# Patient Record
Sex: Female | Born: 1970 | Race: White | Hispanic: No | Marital: Married | Smoking: Never smoker
Health system: Southern US, Community
[De-identification: ages and names within clinical notes are randomized; demographics above are authoritative.]

## PROBLEM LIST (undated history)

## (undated) HISTORY — PX: BREAST BIOPSY: SHX20

## (undated) HISTORY — PX: OTHER SURGICAL HISTORY: SHX169

## (undated) HISTORY — PX: CHOLECYSTECTOMY: SHX55

---

## 1997-11-12 ENCOUNTER — Other Ambulatory Visit: Admission: RE | Admit: 1997-11-12 | Discharge: 1997-11-12 | Payer: Self-pay | Admitting: Gynecology

## 1998-06-17 ENCOUNTER — Other Ambulatory Visit: Admission: RE | Admit: 1998-06-17 | Discharge: 1998-06-17 | Payer: Self-pay | Admitting: Gynecology

## 1998-10-08 ENCOUNTER — Other Ambulatory Visit: Admission: RE | Admit: 1998-10-08 | Discharge: 1998-10-08 | Payer: Self-pay | Admitting: Obstetrics and Gynecology

## 1999-04-13 ENCOUNTER — Inpatient Hospital Stay (HOSPITAL_COMMUNITY): Admission: AD | Admit: 1999-04-13 | Discharge: 1999-04-15 | Payer: Self-pay | Admitting: Obstetrics and Gynecology

## 1999-05-27 ENCOUNTER — Other Ambulatory Visit: Admission: RE | Admit: 1999-05-27 | Discharge: 1999-05-27 | Payer: Self-pay | Admitting: Obstetrics and Gynecology

## 2000-07-09 ENCOUNTER — Other Ambulatory Visit: Admission: RE | Admit: 2000-07-09 | Discharge: 2000-07-09 | Payer: Self-pay | Admitting: Obstetrics and Gynecology

## 2001-05-25 ENCOUNTER — Other Ambulatory Visit: Admission: RE | Admit: 2001-05-25 | Discharge: 2001-05-25 | Payer: Self-pay | Admitting: Obstetrics and Gynecology

## 2001-05-25 ENCOUNTER — Other Ambulatory Visit: Admission: RE | Admit: 2001-05-25 | Discharge: 2001-05-25 | Payer: Self-pay | Admitting: Ophthalmology

## 2001-05-26 ENCOUNTER — Ambulatory Visit (HOSPITAL_COMMUNITY): Admission: AD | Admit: 2001-05-26 | Discharge: 2001-05-26 | Payer: Self-pay | Admitting: Obstetrics and Gynecology

## 2001-05-26 ENCOUNTER — Encounter (INDEPENDENT_AMBULATORY_CARE_PROVIDER_SITE_OTHER): Payer: Self-pay

## 2001-10-19 ENCOUNTER — Other Ambulatory Visit: Admission: RE | Admit: 2001-10-19 | Discharge: 2001-10-19 | Payer: Self-pay | Admitting: Obstetrics and Gynecology

## 2002-02-28 ENCOUNTER — Inpatient Hospital Stay (HOSPITAL_COMMUNITY): Admission: AD | Admit: 2002-02-28 | Discharge: 2002-02-28 | Payer: Self-pay | Admitting: Obstetrics and Gynecology

## 2002-02-28 ENCOUNTER — Encounter: Payer: Self-pay | Admitting: Obstetrics and Gynecology

## 2002-03-14 ENCOUNTER — Encounter: Admission: RE | Admit: 2002-03-14 | Discharge: 2002-03-14 | Payer: Self-pay | Admitting: Obstetrics and Gynecology

## 2002-04-05 ENCOUNTER — Inpatient Hospital Stay (HOSPITAL_COMMUNITY): Admission: AD | Admit: 2002-04-05 | Discharge: 2002-04-08 | Payer: Self-pay | Admitting: Obstetrics and Gynecology

## 2002-04-06 ENCOUNTER — Encounter: Payer: Self-pay | Admitting: Obstetrics and Gynecology

## 2002-04-20 ENCOUNTER — Inpatient Hospital Stay (HOSPITAL_COMMUNITY): Admission: AD | Admit: 2002-04-20 | Discharge: 2002-04-20 | Payer: Self-pay | Admitting: Obstetrics and Gynecology

## 2002-04-21 ENCOUNTER — Inpatient Hospital Stay (HOSPITAL_COMMUNITY): Admission: AD | Admit: 2002-04-21 | Discharge: 2002-04-23 | Payer: Self-pay | Admitting: Obstetrics and Gynecology

## 2002-05-23 ENCOUNTER — Other Ambulatory Visit: Admission: RE | Admit: 2002-05-23 | Discharge: 2002-05-23 | Payer: Self-pay | Admitting: Obstetrics and Gynecology

## 2003-06-11 ENCOUNTER — Other Ambulatory Visit: Admission: RE | Admit: 2003-06-11 | Discharge: 2003-06-11 | Payer: Self-pay | Admitting: Obstetrics and Gynecology

## 2004-08-06 ENCOUNTER — Other Ambulatory Visit: Admission: RE | Admit: 2004-08-06 | Discharge: 2004-08-06 | Payer: Self-pay | Admitting: Obstetrics and Gynecology

## 2007-12-09 ENCOUNTER — Encounter: Admission: RE | Admit: 2007-12-09 | Discharge: 2007-12-09 | Payer: Self-pay | Admitting: Obstetrics and Gynecology

## 2008-12-12 ENCOUNTER — Encounter: Admission: RE | Admit: 2008-12-12 | Discharge: 2008-12-12 | Payer: Self-pay | Admitting: Obstetrics and Gynecology

## 2009-07-15 ENCOUNTER — Encounter: Admission: RE | Admit: 2009-07-15 | Discharge: 2009-07-15 | Payer: Self-pay | Admitting: Obstetrics and Gynecology

## 2010-07-11 NOTE — Op Note (Signed)
[facility]  Patient:    Julie [name], Julie [name] Visit Number: [id number] MRN: [id number]          Service Type: DSU Location: [facility] Attending Physician:  Rhina Brackett Dictated by:   Duke Salvia. Marcelle Overlie, M.D. Proc. Date: 05/26/01 Admit Date:  05/26/2001                             Operative Report  PREOPERATIVE DIAGNOSIS:       Missed abortion.  POSTOPERATIVE DIAGNOSIS:      Missed abortion.  OPERATION:                    Dilation and evacuation.  SURGEON:                      Duke Salvia. Marcelle Overlie, M.D.  ANESTHESIA:                   MAC plus paracervical block.  DESCRIPTION OF PROCEDURE:     The patient was taken to the operating room. After an adequate level of sedation was obtained, with the patients legs in stirrups the peritoneum and vagina were prepped with Betadine.  The bladder was emptied.  Exam was carried out.  Uterus was eight weeks size, midposition. The speculum was position.  The cervix was grasped with a tenaculum.  A paracervical block was created by infiltrating at 3 and 9 oclock submucosally followed by 7 cc of 1% Xylocaine on either side after negative aspiration. The uterus was then sounded to 10 cm and progressively dilated to a 29 Pratt. A #8 curved suction curet was then used to curet a moderate amount of tissue but no further tissue could be removed.  Small blunt curet was used to explore the cavity revealing the walls to be firm and clean.  She tolerated this well and went to the recovery room in good condition. Dictated by:   Duke Salvia. Marcelle Overlie, M.D. Attending Physician:  Rhina Brackett DD:  05/26/01 TD:  05/27/01 Job: [phone] [id number]

## 2010-07-11 NOTE — Discharge Summary (Signed)
   NAMEJERSEY, Julie [name]                       ACCOUNT NO.:  [id number]   MEDICAL RECORD NO.:  [id number]                   PATIENT TYPE:  INP   LOCATION:  9161                                 FACILITY:  [facility]   PHYSICIAN:  Juluis Mire, M.D.                DATE OF BIRTH:  1970-08-31   DATE OF ADMISSION:  04/05/2002  DATE OF DISCHARGE:  04/08/2002                                 DISCHARGE SUMMARY   ADMISSION DIAGNOSIS:  Intrauterine pregnancy at 34 weeks with preterm labor.   DISCHARGE DIAGNOSIS:  Intrauterine pregnancy at 34 weeks with preterm labor.   HISTORY OF PRESENT ILLNESS:  For complete history and physical, please see  the dictated note.   HOSPITAL COURSE:  The patient was brought in at 35 weeks with progressive  cervical changes.  She was 3-4 cm, 80% effaced, with membranes intact.  She  was begun on IV magnesium sulfate as well as antibiotics and given  betamethasone.  Ultrasound evaluation did reveal a thin cervix at 1.8 cm.  Amniotic fluid was normal.  Fetal growth parameters within normal limits.   The patient stopped easily with magnesium sulfate.  She was subsequently  weaned off the magnesium sulfate and begun on p.o. terbutaline.  She  actually remained stable on p.o. terbutaline without contractions or uterine  activity, and the cervix did tend to thicken up.  On 04/07/2002 she was noted  to be 3 cm and 50% effaced with the cervix posterior.  Subsequently, she was  maintained on p.o. terbutaline.  The following day, her cervix remained  unchanged.  Fetal heart rate was reactive without decelerations.  The  patient was discharged home to continue bed rest and p.o. terbutaline.   In terms of complications, none were encountered during her stay in the  hospital.  The patient is discharged home in stable condition.   DISPOSITION:  The patient is to continue on p.o. terbutaline at home as well  as rest.  She is to report increase in uterine activity, leaking, or  bleeding.  She is a gestational diabetic.  We will continue her on her diet,  watching blood sugars carefully.                                               Juluis Mire, M.D.    JSM/MEDQ  D:  04/08/2002  T:  04/08/2002  Job:  [id number]

## 2010-07-11 NOTE — H&P (Signed)
[facility]  Patient:    Julie [name], Julie [name] Visit Number: [id number] MRN: [id number]          Service Type: DSU Location: [facility] Attending Physician:  Rhina Brackett Dictated by:   Duke Salvia. Marcelle Overlie, M.D. Admit Date:  05/26/2001                           History and Physical  CHIEF COMPLAINT:              Missed abortion.  HISTORY OF PRESENT ILLNESS:   This 40 year old G2, P0-1-0-1 at nine weeks presented for a new OB appointment yesterday.  She was, by ultrasound, noted to have a missed abortion.  There was a nine-week IUP with no FHR noted.  She presents now for D&E.  This procedure including the risks of bleeding, infection, and other complications that may require additional surgery were reviewed with her.  Her blood type is O positive.  PAST OBSTETRIC HISTORY:       Vaginal delivery in February 2001.  ALLERGIES:                    None.  REVIEW OF SYSTEMS:            Unremarkable.  PHYSICAL EXAMINATION:  VITAL SIGNS:                  Temperature 98.2, blood pressure 110/70.  HEENT:                        Unremarkable.  NECK:                         Supple without masses.  LUNGS:                        Clear.  CARDIOVASCULAR:               Regular rate and rhythm without murmurs, rubs, or gallops noted.  BREASTS:                      Without masses.  ABDOMEN:                      Soft, flat and nontender.  PELVIC:                       Normal external genitalia.  The vagina and cervix were clear.  The uterus was nine weeks in size and midposition.  Adnexa negative.  EXTREMITIES:                  Unremarkable.  NEUROLOGIC:                   Unremarkable.  IMPRESSION:                   Nine-week intrauterine pregnancy, misses abortion.  PLAN:                         Dilation and evacuation.  the procedure and risks were reviewed as above. Dictated by:   Duke Salvia. Marcelle Overlie, M.D. Attending Physician:  Rhina Brackett DD:  05/25/01 TD:  05/26/01 Job: [phone] [id number]

## 2010-07-11 NOTE — H&P (Signed)
   [name], [name]                       ACCOUNT NO.:  [id number]   MEDICAL RECORD NO.:  [id number]                   PATIENT TYPE:  INP   LOCATION:  9161                                 FACILITY:  [facility]   PHYSICIAN:  Duke Salvia. Marcelle Overlie, M.D.            DATE OF BIRTH:  May 14, 1970   DATE OF ADMISSION:  04/05/2002  DATE OF DISCHARGE:                                HISTORY & PHYSICAL   CHIEF COMPLAINT:  Preterm labor.   HISTORY OF PRESENT ILLNESS:  The patient is a 40 year old, G3, P1-0-1-1, EDD  05/19/2002, EDA 34 weeks. The patient called earlier today saying she had  leaked a small amount of fluid. She was sent to triage for evaluation where  she was fern and nitrazine negative, does not appear to be uncomfortable,  but by routine check, she was 3 to 4 cm dilated with moderate bulging  membranes, vertex presenting, and is admitted for preterm labor.   Her prenatal course has been uncomplicated.  Her blood type is O positive,  rubella titer positive.  One-hour GTT was 150,  Three-hour GTT showed a one  hour of 220, two hour of 167.  She has been on low carbohydrate diet for  gestational diabetes.   ALLERGIES:  None.   PAST SURGICAL HISTORY:  1. Dilation and evacuation in 2003.  2. One vaginal delivery in 2001 of 5 pound 14 ounce female at 36 weeks.   REVIEW OF SYSTEMS:  Significant for a sister with MS.   PHYSICAL EXAMINATION:  VITAL SIGNS:  Temperature 98.2, blood pressure  108/68.  HEENT:  Unremarkable.  NECK:  Supple without masses.  LUNGS:  Clear.  CARDIOVASCULAR:  Regular rate and rhythm without murmur, rub, or gallop.  BREASTS:  Not examined.  ABDOMEN:  Height 34 cm, heart rate 140.  PELVIC:  Cervix by nurse's exam was 3 to 4, 80%, membranes intact, fern and  nitrazine negative.  EXTREMITIES:  Unremarkable.  NEUROLOGIC:  Unremarkable.   IMPRESSION:  1. Intrauterine pregnancy at 34 weeks.  2.     Gestational diabetes.  3. Preterm labor.   PLAN:  Will admit  for tocolytics, betamethasone, IV antibiotics.  OB  ultrasound in the a.m.                                                  Richard M. Marcelle Overlie, M.D.    RMH/MEDQ  D:  04/05/2002  T:  04/05/2002  Job:  [id number]

## 2010-10-10 ENCOUNTER — Other Ambulatory Visit: Payer: Self-pay | Admitting: Obstetrics and Gynecology

## 2010-11-20 ENCOUNTER — Other Ambulatory Visit: Payer: Self-pay | Admitting: Obstetrics and Gynecology

## 2010-11-20 DIAGNOSIS — N6489 Other specified disorders of breast: Secondary | ICD-10-CM

## 2011-02-09 ENCOUNTER — Other Ambulatory Visit: Payer: Self-pay | Admitting: Obstetrics and Gynecology

## 2011-02-09 DIAGNOSIS — N6489 Other specified disorders of breast: Secondary | ICD-10-CM

## 2011-02-23 ENCOUNTER — Ambulatory Visit
Admission: RE | Admit: 2011-02-23 | Discharge: 2011-02-23 | Disposition: A | Discharge status: Home / Self Care | Payer: 59 | Source: Ambulatory Visit | Attending: Obstetrics and Gynecology | Admitting: Obstetrics and Gynecology

## 2011-02-23 DIAGNOSIS — N6489 Other specified disorders of breast: Secondary | ICD-10-CM

## 2011-06-09 ENCOUNTER — Ambulatory Visit (INDEPENDENT_AMBULATORY_CARE_PROVIDER_SITE_OTHER): Payer: 59 | Admitting: Family Medicine

## 2011-06-09 ENCOUNTER — Ambulatory Visit: Payer: 59

## 2011-06-09 VITALS — BP 136/87 | HR 94 | Temp 98.0°F | Resp 18 | Ht 65.0 in | Wt 149.8 lb

## 2011-06-09 DIAGNOSIS — M79609 Pain in unspecified limb: Secondary | ICD-10-CM

## 2011-06-09 DIAGNOSIS — M79605 Pain in left leg: Secondary | ICD-10-CM

## 2011-06-09 MED ORDER — DICLOFENAC SODIUM 1 % TD GEL: 1.0000 "application " | Freq: Two times a day (BID) | TRANSDERMAL | Status: DC

## 2011-06-09 NOTE — Progress Notes (Signed)
  [facility]:  Office Visit  Chief Complaint:  Chief Complaint  Patient presents with  . Leg Swelling    left; injury from January 2013    HPI: Julie [name] is a 41 y.o. female who complains of  Left leg pain x 3 months, rate as 2-3/10. Aching, swelling at noon and night s/p use. Occurred after Storage ottoman landed on her when she was helping sister move. Landed on her leg and still has pain after all this time.    History reviewed. No pertinent past medical history. Past Surgical History  Procedure Date  . Cholecystectomy   . Uterine ablation     for menorrhagia   History   Social History  . Marital Status: Married    Spouse Name: N/A    Number of Children: N/A  . Years of Education: N/A   Social History Main Topics  . Smoking status: Never Smoker   . Smokeless tobacco: None  . Alcohol Use: No  . Drug Use: No  . Sexually Active: None   Other Topics Concern  . None   Social History Narrative  . None   Family History  Problem Relation Age of Onset  . Cancer Maternal Grandmother    No Known Allergies Prior to Admission medications   Medication Sig Start Date End Date Taking? Authorizing Provider  nortriptyline (PAMELOR) 75 MG capsule Take 75 mg by mouth at bedtime.   Yes Historical Provider, MD     ROS: The patient denies fevers, chills, night sweats, unintentional weight loss, chest pain, palpitations, wheezing, dyspnea on exertion, nausea, vomiting, abdominal pain, dysuria, hematuria, melena, numbness, weakness, or tingling. + leg pain  All other systems have been reviewed and were otherwise negative with the exception of those mentioned in the HPI and as above.    PHYSICAL EXAM: Filed Vitals:   06/09/11 1227  BP: 136/87  Pulse: 94  Temp: 98 F (36.7 C)  Resp: 18   Filed Vitals:   06/09/11 1227  Height: 5\' 5"  (1.651 m)  Weight: 149 lb 12.8 oz (67.949 kg)   Body mass index is 24.93 kg/(m^2).  General: Alert, no acute  distress HEENT:  Normocephalic, atraumatic, oropharynx patent.  Cardiovascular:  Regular rate and rhythm, no rubs murmurs or gallops.  No Carotid bruits, radial pulse intact. No pedal edema.  Respiratory: Clear to auscultation bilaterally.  No wheezes, rales, or rhonchi.  No cyanosis, no use of accessory musculature GI: No organomegaly, abdomen is soft and non-tender, positive bowel sounds.  No masses. Skin: No rashes. Neurologic: Facial musculature symmetric. Psychiatric: Patient is appropriate throughout our interaction. Lymphatic: No cervical lymphadenopathy Musculoskeletal: Gait intact. Right leg: Normal neurovasc. exam, + soft tissue tenderness on distal tibia ( rt), full ROM, 5/5 strength, sensation intact. There is slight fibrous sensation of small localized region along distal tibia.   LABS: No results found for this or any previous visit.   EKG/XRAY:   Primary read interpreted by Dr. Conley Rolls at [facility]. No fx/dislocation   ASSESSMENT/PLAN: Encounter Diagnosis  Name Primary?  . Left leg pain Yes   Most likely soft tissue contusion, ? Recovering bone contusion. Rx Voltaren get BID.  Reassurance given since xrays negative.  F/u prn   Brenner Visconti PHUONG, DO 06/09/2011 1:37 PM

## 2012-04-27 ENCOUNTER — Other Ambulatory Visit: Payer: Self-pay | Admitting: Obstetrics and Gynecology

## 2012-04-27 DIAGNOSIS — R928 Other abnormal and inconclusive findings on diagnostic imaging of breast: Secondary | ICD-10-CM

## 2012-05-10 ENCOUNTER — Ambulatory Visit
Admission: RE | Admit: 2012-05-10 | Discharge: 2012-05-10 | Disposition: A | Discharge status: Home / Self Care | Payer: 59 | Source: Ambulatory Visit | Attending: Obstetrics and Gynecology | Admitting: Obstetrics and Gynecology

## 2012-05-10 ENCOUNTER — Other Ambulatory Visit: Payer: Self-pay | Admitting: Obstetrics and Gynecology

## 2012-05-10 DIAGNOSIS — R928 Other abnormal and inconclusive findings on diagnostic imaging of breast: Secondary | ICD-10-CM

## 2012-05-10 DIAGNOSIS — N6320 Unspecified lump in the left breast, unspecified quadrant: Secondary | ICD-10-CM

## 2012-05-11 ENCOUNTER — Other Ambulatory Visit: Payer: Self-pay | Admitting: Obstetrics and Gynecology

## 2012-05-11 DIAGNOSIS — N6489 Other specified disorders of breast: Secondary | ICD-10-CM

## 2012-05-18 ENCOUNTER — Ambulatory Visit
Admission: RE | Admit: 2012-05-18 | Discharge: 2012-05-18 | Disposition: A | Discharge status: Home / Self Care | Payer: 59 | Source: Ambulatory Visit | Attending: Obstetrics and Gynecology | Admitting: Obstetrics and Gynecology

## 2012-05-18 DIAGNOSIS — N6489 Other specified disorders of breast: Secondary | ICD-10-CM

## 2012-05-18 MED ORDER — GADOBENATE DIMEGLUMINE 529 MG/ML IV SOLN
14.0000 mL | Freq: Once | INTRAVENOUS | Status: AC | PRN
  Administered 2012-05-18: 14 mL via INTRAVENOUS

## 2013-03-17 ENCOUNTER — Other Ambulatory Visit: Payer: Self-pay

## 2013-03-17 DIAGNOSIS — Z1231 Encounter for screening mammogram for malignant neoplasm of breast: Secondary | ICD-10-CM

## 2013-04-24 ENCOUNTER — Ambulatory Visit
Admission: RE | Admit: 2013-04-24 | Discharge: 2013-04-24 | Disposition: A | Discharge status: Home / Self Care | Payer: Self-pay | Source: Ambulatory Visit

## 2013-04-24 DIAGNOSIS — Z1231 Encounter for screening mammogram for malignant neoplasm of breast: Secondary | ICD-10-CM

## 2014-07-06 ENCOUNTER — Other Ambulatory Visit: Payer: Self-pay

## 2014-07-06 DIAGNOSIS — Z1231 Encounter for screening mammogram for malignant neoplasm of breast: Secondary | ICD-10-CM

## 2014-07-19 ENCOUNTER — Other Ambulatory Visit: Payer: Self-pay | Admitting: Obstetrics and Gynecology

## 2014-07-25 LAB — CYTOLOGY - PAP

## 2014-08-02 ENCOUNTER — Ambulatory Visit
Admission: RE | Admit: 2014-08-02 | Discharge: 2014-08-02 | Disposition: A | Discharge status: Home / Self Care | Payer: 59 | Source: Ambulatory Visit

## 2014-08-02 DIAGNOSIS — Z1231 Encounter for screening mammogram for malignant neoplasm of breast: Secondary | ICD-10-CM

## 2014-08-13 ENCOUNTER — Other Ambulatory Visit: Payer: Self-pay | Admitting: Obstetrics and Gynecology

## 2014-08-16 LAB — CYTOLOGY - PAP

## 2015-07-09 ENCOUNTER — Other Ambulatory Visit: Payer: Self-pay

## 2015-07-09 DIAGNOSIS — Z1231 Encounter for screening mammogram for malignant neoplasm of breast: Secondary | ICD-10-CM

## 2015-08-06 ENCOUNTER — Ambulatory Visit
Admission: RE | Admit: 2015-08-06 | Discharge: 2015-08-06 | Disposition: A | Discharge status: Home / Self Care | Payer: 59 | Source: Ambulatory Visit

## 2015-08-06 ENCOUNTER — Other Ambulatory Visit: Payer: Self-pay | Admitting: Obstetrics and Gynecology

## 2015-08-06 DIAGNOSIS — Z1231 Encounter for screening mammogram for malignant neoplasm of breast: Secondary | ICD-10-CM

## 2015-09-09 ENCOUNTER — Ambulatory Visit (INDEPENDENT_AMBULATORY_CARE_PROVIDER_SITE_OTHER): Payer: 59 | Admitting: Family Medicine

## 2015-09-09 VITALS — BP 126/78 | HR 92 | Temp 98.1°F | Resp 17 | Ht 65.5 in | Wt 153.0 lb

## 2015-09-09 DIAGNOSIS — W57XXXA Bitten or stung by nonvenomous insect and other nonvenomous arthropods, initial encounter: Secondary | ICD-10-CM

## 2015-09-09 DIAGNOSIS — S90861A Insect bite (nonvenomous), right foot, initial encounter: Secondary | ICD-10-CM | POA: Diagnosis not present

## 2015-09-09 DIAGNOSIS — S90821A Blister (nonthermal), right foot, initial encounter: Secondary | ICD-10-CM

## 2015-09-09 DIAGNOSIS — T63421A Toxic effect of venom of ants, accidental (unintentional), initial encounter: Secondary | ICD-10-CM

## 2015-09-09 MED ORDER — TRIAMCINOLONE ACETONIDE 0.1 % EX CREA: 1.0000 "application " | TOPICAL_CREAM | Freq: Two times a day (BID) | CUTANEOUS | Status: AC

## 2015-09-09 MED ORDER — METHYLPREDNISOLONE ACETATE 80 MG/ML IJ SUSP
80.0000 mg | Freq: Once | INTRAMUSCULAR | Status: AC
  Administered 2015-09-09: 80 mg via INTRAMUSCULAR

## 2015-09-09 NOTE — Patient Instructions (Signed)
You have received a shot of Depo-Medrol 80, and injectable long-acting cortisone which will keep treating this for the next few days.  Use triamcinolone cream 2 or 3 times daily on the blistered area  If the skin of the blister starts tearing away you can gently trim it off.  Return as needed

## 2015-09-09 NOTE — Progress Notes (Signed)
Patient ID: Julie [name], female    DOB: [birth year]  Age: 45 y.o. MRN: [id number]  Chief Complaint  Patient presents with  . Foot Injury    blister on right foot     Subjective:   Patient was walking her dog and was stung by a fire ant on her foot 2 days ago. Yesterday she noticed a blistering,. The blister is gotten large and tight. It itches.  Current allergies, medications, problem list, past/family and social histories reviewed.  Objective:  BP 126/78 mmHg  Pulse 92  Temp(Src) 98.1 F (36.7 C) (Oral)  Resp 17  Ht 5' 5.5" (1.664 m)  Wt 153 lb (69.4 kg)  BMI 25.06 kg/m2  SpO2 100%  Just proximal to her fourth and fifth toes of the right foot there is a blister about 2 x 3 cm cyst very tightly swollen, superficial skin involvement. This was cleaned with alcohol and the fluid aspirated out of it rather than allow it to pop and tear  Assessment & Plan:   Assessment: 1. Fire ant sting   2. Blister of foot, right, initial encounter       Plan: Used triamcinolone cream, give a shot of Depo-Medrol, return if problems  No orders of the defined types were placed in this encounter.    Meds ordered this encounter  Medications  . methylPREDNISolone acetate (DEPO-MEDROL) injection 80 mg    Sig:   . triamcinolone cream (KENALOG) 0.1 %    Sig: Apply 1 application topically 2 (two) times daily.    Dispense:  30 g    Refill:  0         Patient Instructions  You have received a shot of Depo-Medrol 80, and injectable long-acting cortisone which will keep treating this for the next few days.  Use triamcinolone cream 2 or 3 times daily on the blistered area  If the skin of the blister starts tearing away you can gently trim it off.  Return as needed     Return if symptoms worsen or fail to improve.   HOPPER,DAVID, MD 09/09/2015

## 2016-03-30 DIAGNOSIS — G43009 Migraine without aura, not intractable, without status migrainosus: Secondary | ICD-10-CM | POA: Diagnosis not present

## 2016-04-06 DIAGNOSIS — Z23 Encounter for immunization: Secondary | ICD-10-CM | POA: Diagnosis not present

## 2016-09-15 ENCOUNTER — Other Ambulatory Visit: Payer: Self-pay | Admitting: Obstetrics and Gynecology

## 2016-09-15 DIAGNOSIS — Z1231 Encounter for screening mammogram for malignant neoplasm of breast: Secondary | ICD-10-CM

## 2016-09-16 ENCOUNTER — Ambulatory Visit
Admission: RE | Admit: 2016-09-16 | Discharge: 2016-09-16 | Disposition: A | Discharge status: Home / Self Care | Payer: 59 | Source: Ambulatory Visit | Attending: Obstetrics and Gynecology | Admitting: Obstetrics and Gynecology

## 2016-09-16 DIAGNOSIS — Z1231 Encounter for screening mammogram for malignant neoplasm of breast: Secondary | ICD-10-CM

## 2016-10-22 DIAGNOSIS — Z01419 Encounter for gynecological examination (general) (routine) without abnormal findings: Secondary | ICD-10-CM | POA: Diagnosis not present

## 2016-12-29 DIAGNOSIS — L700 Acne vulgaris: Secondary | ICD-10-CM | POA: Diagnosis not present

## 2017-04-05 DIAGNOSIS — G43009 Migraine without aura, not intractable, without status migrainosus: Secondary | ICD-10-CM | POA: Diagnosis not present

## 2017-05-17 ENCOUNTER — Other Ambulatory Visit: Payer: Self-pay

## 2017-05-17 ENCOUNTER — Ambulatory Visit: Payer: 59 | Admitting: Family Medicine

## 2017-05-17 ENCOUNTER — Encounter: Payer: Self-pay | Admitting: Family Medicine

## 2017-05-17 VITALS — BP 127/84 | HR 91 | Temp 97.7°F | Resp 18 | Ht 65.5 in | Wt 162.6 lb

## 2017-05-17 DIAGNOSIS — N3001 Acute cystitis with hematuria: Secondary | ICD-10-CM

## 2017-05-17 DIAGNOSIS — R81 Glycosuria: Secondary | ICD-10-CM | POA: Diagnosis not present

## 2017-05-17 DIAGNOSIS — R300 Dysuria: Secondary | ICD-10-CM | POA: Diagnosis not present

## 2017-05-17 LAB — GLUCOSE, POCT (MANUAL RESULT ENTRY): POC GLUCOSE: 103 mg/dL — AB (ref 70–99)

## 2017-05-17 LAB — POCT URINALYSIS DIP (MANUAL ENTRY)
Bilirubin, UA: NEGATIVE
Ketones, POC UA: NEGATIVE mg/dL
NITRITE UA: POSITIVE — AB
Protein Ur, POC: NEGATIVE mg/dL
Spec Grav, UA: <=1.005 — AB (ref 1.010–1.025)
Urobilinogen, UA: 0.2 E.U./dL
pH, UA: 6 (ref 5.0–8.0)

## 2017-05-17 LAB — POC MICROSCOPIC URINALYSIS (UMFC): Mucus: ABSENT

## 2017-05-17 MED ORDER — PHENAZOPYRIDINE HCL 100 MG PO TABS: 100.0000 mg | ORAL_TABLET | Freq: Three times a day (TID) | ORAL | 0 refills | Status: AC | PRN

## 2017-05-17 MED ORDER — NITROFURANTOIN MONOHYD MACRO 100 MG PO CAPS: 100.0000 mg | ORAL_CAPSULE | Freq: Two times a day (BID) | ORAL | 0 refills | Status: AC

## 2017-05-17 NOTE — Progress Notes (Signed)
Subjective:  By signing my name below, I, Stann Ore, attest that this documentation has been prepared under the direction and in the presence of Meredith Staggers, MD. Electronically Signed: Stann Ore, Scribe. 05/17/2017 , 1:44 PM .  Patient was seen in Room 10 .   Patient ID: Julie [name], female    DOB: 17-Jul-1970, 47 y.o.   MRN: [id number] Chief Complaint  Patient presents with  . Urinary Tract Infection    x yesterday, dysuria, no hematuria.  Took AZO this morning and drinking lots of water   HPI Julie [name] is a 47 y.o. female  Here for possible UTI. She ate lunch at around 12:00PM.   Patient states her symptoms started yesterday with more urinary frequency and urgency with decreased urine amount. She denies seeing hematuria. She notes last UTI was about a year ago, treated with 1 round of antibiotics. She denies fever, nausea, vomiting, diarrhea, or back pain. She took Azo once this morning. She denies any antibiotics recently. She denies antibiotic allergies. She had sugar issues when she was pregnant; not recently checked.   There are no active problems to display for this patient.  No past medical history on file. Past Surgical History:  Procedure Laterality Date  . BREAST BIOPSY Left    benign  . CHOLECYSTECTOMY    . uterine ablation     for menorrhagia   No Known Allergies Prior to Admission medications   Medication Sig Start Date End Date Taking? Authorizing Provider  nortriptyline (PAMELOR) 75 MG capsule Take 75 mg by mouth at bedtime.    [provider]  triamcinolone cream (KENALOG) 0.1 % Apply 1 application topically 2 (two) times daily. 09/09/15   Peyton Najjar, MD   Social History   Socioeconomic History  . Marital status: Married    Spouse name: Not on file  . Number of children: Not on file  . Years of education: Not on file  . Highest education level: Not on file  Occupational History  . Not on file  Social Needs  .  Financial resource strain: Not on file  . Food insecurity:    Worry: Not on file    Inability: Not on file  . Transportation needs:    Medical: Not on file    Non-medical: Not on file  Tobacco Use  . Smoking status: Never Smoker  . Smokeless tobacco: Never Used  Substance and Sexual Activity  . Alcohol use: No  . Drug use: No  . Sexual activity: Never  Lifestyle  . Physical activity:    Days per week: Not on file    Minutes per session: Not on file  . Stress: Not on file  Relationships  . Social connections:    Talks on phone: Not on file    Gets together: Not on file    Attends religious service: Not on file    Active member of club or organization: Not on file    Attends meetings of clubs or organizations: Not on file    Relationship status: Not on file  . Intimate partner violence:    Fear of current or ex partner: Not on file    Emotionally abused: Not on file    Physically abused: Not on file    Forced sexual activity: Not on file  Other Topics Concern  . Not on file  Social History Narrative  . Not on file   Review of Systems  Constitutional: Negative for chills, fatigue,  fever and unexpected weight change.  Respiratory: Negative for cough.   Gastrointestinal: Negative for abdominal pain, constipation, diarrhea, nausea and vomiting.  Genitourinary: Positive for decreased urine volume, frequency and urgency. Negative for hematuria.  Musculoskeletal: Negative for back pain.  Skin: Negative for rash and wound.  Neurological: Negative for dizziness, weakness and headaches.       Objective:   Physical Exam  Constitutional: She is oriented to person, place, and time. She appears well-developed and well-nourished. No distress.  HENT:  Head: Normocephalic and atraumatic.  Eyes: Pupils are equal, round, and reactive to light. EOM are normal.  Neck: Neck supple.  Cardiovascular: Normal rate.  Pulmonary/Chest: Effort normal. No respiratory distress.  Abdominal:  Soft. Bowel sounds are normal. She exhibits no distension. There is no tenderness. There is no CVA tenderness.  Musculoskeletal: Normal range of motion.  Neurological: She is alert and oriented to person, place, and time.  Skin: Skin is warm and dry.  Psychiatric: She has a normal mood and affect. Her behavior is normal.  Nursing note and vitals reviewed.   Vitals:   05/17/17 1324  BP: 127/84  Pulse: 91  Resp: 18  Temp: 97.7 F (36.5 C)  TempSrc: Oral  SpO2: 99%  Weight: 162 lb 9.6 oz (73.8 kg)  Height: 5' 5.5" (1.664 m)   Results for orders placed or performed in visit on 05/17/17  POCT urinalysis dipstick  Result Value Ref Range   Color, UA yellow yellow   Clarity, UA cloudy (A) clear   Glucose, UA =100 (A) negative mg/dL   Bilirubin, UA negative negative   Ketones, POC UA negative negative mg/dL   Spec Grav, UA <=8.657<=1.005 (A) 1.010 - 1.025   Blood, UA trace-lysed (A) negative   pH, UA 6.0 5.0 - 8.0   Protein Ur, POC negative negative mg/dL   Urobilinogen, UA 0.2 0.2 or 1.0 E.U./dL   Nitrite, UA Positive (A) Negative   Leukocytes, UA Small (1+) (A) Negative  POCT glucose (manual entry)  Result Value Ref Range   POC Glucose 103 (A) 70 - 99 mg/dl       Assessment & Plan:  Julie [name] is a 47 y.o. female Acute cystitis with hematuria - Plan: phenazopyridine (PYRIDIUM) 100 MG tablet, nitrofurantoin, macrocrystal-monohydrate, (MACROBID) 100 MG capsule  Dysuria - Plan: POCT urinalysis dipstick, Urine Culture, POCT Microscopic Urinalysis ([facility])  Glycosuria - Plan: POCT glucose (manual entry)  - acute cystitis with hematuria. No concerns on exam.   - macrobid x 7 days, pyridium, check urine cx.   - possible glycosuria, but had taken pyridium. Fingerstick glucose ok.   - sx care and rtc precautions discussed.   Meds ordered this encounter  Medications  . phenazopyridine (PYRIDIUM) 100 MG tablet    Sig: Take 1 tablet (100 mg total) by mouth 3 (three) times daily as  needed for pain.    Dispense:  10 tablet    Refill:  0  . nitrofurantoin, macrocrystal-monohydrate, (MACROBID) 100 MG capsule    Sig: Take 1 capsule (100 mg total) by mouth 2 (two) times daily.    Dispense:  14 capsule    Refill:  0   Patient Instructions   Start macrodantin for urinary tract infection, pyridium if needed for symptoms and drink plenty of fluids. Return to the clinic or go to the nearest emergency room if any of your symptoms worsen or new symptoms occur.   Urinary Tract Infection, Adult A urinary tract infection (UTI) is an infection  of any part of the urinary tract. The urinary tract includes the:  Kidneys.  Ureters.  Bladder.  Urethra.  These organs make, store, and get rid of pee (urine) in the body. Follow these instructions at home:  Take over-the-counter and prescription medicines only as told by your doctor.  If you were prescribed an antibiotic medicine, take it as told by your doctor. Do not stop taking the antibiotic even if you start to feel better.  Avoid the following drinks: ? Alcohol. ? Caffeine. ? Tea. ? Carbonated drinks.  Drink enough fluid to keep your pee clear or pale yellow.  Keep all follow-up visits as told by your doctor. This is important.  Make sure to: ? Empty your bladder often and completely. Do not to hold pee for long periods of time. ? Empty your bladder before and after sex. ? Wipe from front to back after a bowel movement if you are female. Use each tissue one time when you wipe. Contact a doctor if:  You have back pain.  You have a fever.  You feel sick to your stomach (nauseous).  You throw up (vomit).  Your symptoms do not get better after 3 days.  Your symptoms go away and then come back. Get help right away if:  You have very bad back pain.  You have very bad lower belly (abdominal) pain.  You are throwing up and cannot keep down any medicines or water. This information is not intended to  replace advice given to you by your health care provider. Make sure you discuss any questions you have with your health care provider. Document Released: 07/29/2007 Document Revised: 07/18/2015 Document Reviewed: 12/31/2014 Elsevier Interactive Patient Education  2018 [employer].     IF you received an x-ray today, you will receive an invoice from [facility]. Please contact [facility] at [phone] with questions or concerns regarding your invoice.   IF you received labwork today, you will receive an invoice from [place]. Please contact [facility] at [phone] with questions or concerns regarding your invoice.   Our billing staff will not be able to assist you with questions regarding bills from these companies.  You will be contacted with the lab results as soon as they are available. The fastest way to get your results is to activate your My Chart account. Instructions are located on the last page of this paperwork. If you have not heard from [place] regarding the results in 2 weeks, please contact this office.    We recommend that you schedule a mammogram for breast cancer screening. Typically, you do not need a referral to do this. Please contact a local imaging center to schedule your mammogram.  [facility] - [phone]  *ask for the Radiology Department [facility]) - [phone] or [phone]  [facility] - [phone] [facility] - [phone] [facility] - [phone]  *ask for the Radiology Department [facility] - [phone]  *ask for the Radiology Department [facility] - [phone]  *ask for the Mammography Department [facility] - [phone]  I personally performed the services described in this documentation, which was scribed in my presence. The recorded information has been reviewed and considered for accuracy and  completeness, addended by me as needed, and agree with information above.  Signed,   Meredith Staggers, MD Primary Care at [facility].  05/17/17 1:54 PM

## 2017-05-17 NOTE — Progress Notes (Signed)
Shay   

## 2017-05-17 NOTE — Patient Instructions (Addendum)
Start macrodantin for urinary tract infection, pyridium if needed for symptoms and drink plenty of fluids. Return to the clinic or go to the nearest emergency room if any of your symptoms worsen or new symptoms occur.   Urinary Tract Infection, Adult A urinary tract infection (UTI) is an infection of any part of the urinary tract. The urinary tract includes the:  Kidneys.  Ureters.  Bladder.  Urethra.  These organs make, store, and get rid of pee (urine) in the body. Follow these instructions at home:  Take over-the-counter and prescription medicines only as told by your doctor.  If you were prescribed an antibiotic medicine, take it as told by your doctor. Do not stop taking the antibiotic even if you start to feel better.  Avoid the following drinks: ? Alcohol. ? Caffeine. ? Tea. ? Carbonated drinks.  Drink enough fluid to keep your pee clear or pale yellow.  Keep all follow-up visits as told by your doctor. This is important.  Make sure to: ? Empty your bladder often and completely. Do not to hold pee for long periods of time. ? Empty your bladder before and after sex. ? Wipe from front to back after a bowel movement if you are female. Use each tissue one time when you wipe. Contact a doctor if:  You have back pain.  You have a fever.  You feel sick to your stomach (nauseous).  You throw up (vomit).  Your symptoms do not get better after 3 days.  Your symptoms go away and then come back. Get help right away if:  You have very bad back pain.  You have very bad lower belly (abdominal) pain.  You are throwing up and cannot keep down any medicines or water. This information is not intended to replace advice given to you by your health care provider. Make sure you discuss any questions you have with your health care provider. Document Released: 07/29/2007 Document Revised: 07/18/2015 Document Reviewed: 12/31/2014 Elsevier Interactive Patient Education  2018  [employer]     IF you received an x-ray today, you will receive an invoice from [facility]. Please contact [facility] at [phone] with questions or concerns regarding your invoice.   IF you received labwork today, you will receive an invoice from [facility]. Please contact [facility] at [phone] with questions or concerns regarding your invoice.   Our billing staff will not be able to assist you with questions regarding bills from these companies.  You will be contacted with the lab results as soon as they are available. The fastest way to get your results is to activate your My Chart account. Instructions are located on the last page of this paperwork. If you have not heard from us regarding the results in 2 weeks, please contact this office.    We recommend that you schedule a mammogram for breast cancer screening. Typically, you do not need a referral to do this. Please contact a local imaging center to schedule your mammogram.  [facility] - [phone]  *ask for the Radiology Department [facility] - [phone] or [phone]  [facility] - [phone] [name] - [phone] [facility] - [phone]) [phone]  *ask for the Radiology Department [facility] - [phone]  *ask for the Radiology Department [facility] - [phone]) [phone]  *ask for the Mammography Department [facility] - [phone]) [phone]

## 2017-05-19 LAB — URINE CULTURE

## 2017-05-20 ENCOUNTER — Telehealth: Payer: Self-pay | Admitting: Internal Medicine

## 2017-05-20 NOTE — Telephone Encounter (Signed)
Result note read to patient; verbalizes understanding. Result note was not routed to [facility]. 

## 2017-09-28 ENCOUNTER — Other Ambulatory Visit: Payer: Self-pay | Admitting: Obstetrics and Gynecology

## 2017-09-28 DIAGNOSIS — Z1231 Encounter for screening mammogram for malignant neoplasm of breast: Secondary | ICD-10-CM

## 2017-10-18 ENCOUNTER — Ambulatory Visit
Admission: RE | Admit: 2017-10-18 | Discharge: 2017-10-18 | Disposition: A | Discharge status: Home / Self Care | Payer: 59 | Source: Ambulatory Visit | Attending: Obstetrics and Gynecology | Admitting: Obstetrics and Gynecology

## 2017-10-18 DIAGNOSIS — Z1231 Encounter for screening mammogram for malignant neoplasm of breast: Secondary | ICD-10-CM | POA: Diagnosis not present

## 2017-10-21 DIAGNOSIS — M9903 Segmental and somatic dysfunction of lumbar region: Secondary | ICD-10-CM | POA: Diagnosis not present

## 2017-10-21 DIAGNOSIS — M5431 Sciatica, right side: Secondary | ICD-10-CM | POA: Diagnosis not present

## 2017-10-21 DIAGNOSIS — M9904 Segmental and somatic dysfunction of sacral region: Secondary | ICD-10-CM | POA: Diagnosis not present

## 2017-11-11 DIAGNOSIS — Z6826 Body mass index (BMI) 26.0-26.9, adult: Secondary | ICD-10-CM | POA: Diagnosis not present

## 2017-11-11 DIAGNOSIS — Z01419 Encounter for gynecological examination (general) (routine) without abnormal findings: Secondary | ICD-10-CM | POA: Diagnosis not present

## 2017-11-26 DIAGNOSIS — L700 Acne vulgaris: Secondary | ICD-10-CM | POA: Diagnosis not present

## 2018-04-03 DIAGNOSIS — N3090 Cystitis, unspecified without hematuria: Secondary | ICD-10-CM | POA: Diagnosis not present

## 2018-04-03 DIAGNOSIS — N3001 Acute cystitis with hematuria: Secondary | ICD-10-CM | POA: Diagnosis not present

## 2018-04-11 DIAGNOSIS — G43009 Migraine without aura, not intractable, without status migrainosus: Secondary | ICD-10-CM | POA: Diagnosis not present

## 2018-11-14 ENCOUNTER — Other Ambulatory Visit: Payer: Self-pay | Admitting: Obstetrics and Gynecology

## 2018-11-14 DIAGNOSIS — Z1231 Encounter for screening mammogram for malignant neoplasm of breast: Secondary | ICD-10-CM

## 2019-01-03 ENCOUNTER — Other Ambulatory Visit: Payer: Self-pay

## 2019-01-03 ENCOUNTER — Ambulatory Visit
Admission: RE | Admit: 2019-01-03 | Discharge: 2019-01-03 | Disposition: A | Discharge status: Home / Self Care | Payer: 59 | Source: Ambulatory Visit | Attending: Obstetrics and Gynecology | Admitting: Obstetrics and Gynecology

## 2019-01-03 DIAGNOSIS — Z1231 Encounter for screening mammogram for malignant neoplasm of breast: Secondary | ICD-10-CM

## 2020-01-24 IMAGING — MG DIGITAL SCREENING BILATERAL MAMMOGRAM WITH TOMO AND CAD
3 series · 3 of 15 positions shown · non-contrast
Comparison: Previous exam(s).

CLINICAL DATA: Screening.

EXAM:
DIGITAL SCREENING BILATERAL MAMMOGRAM WITH TOMO AND CAD

[L CC tomo · tomo slice 37/74.0]
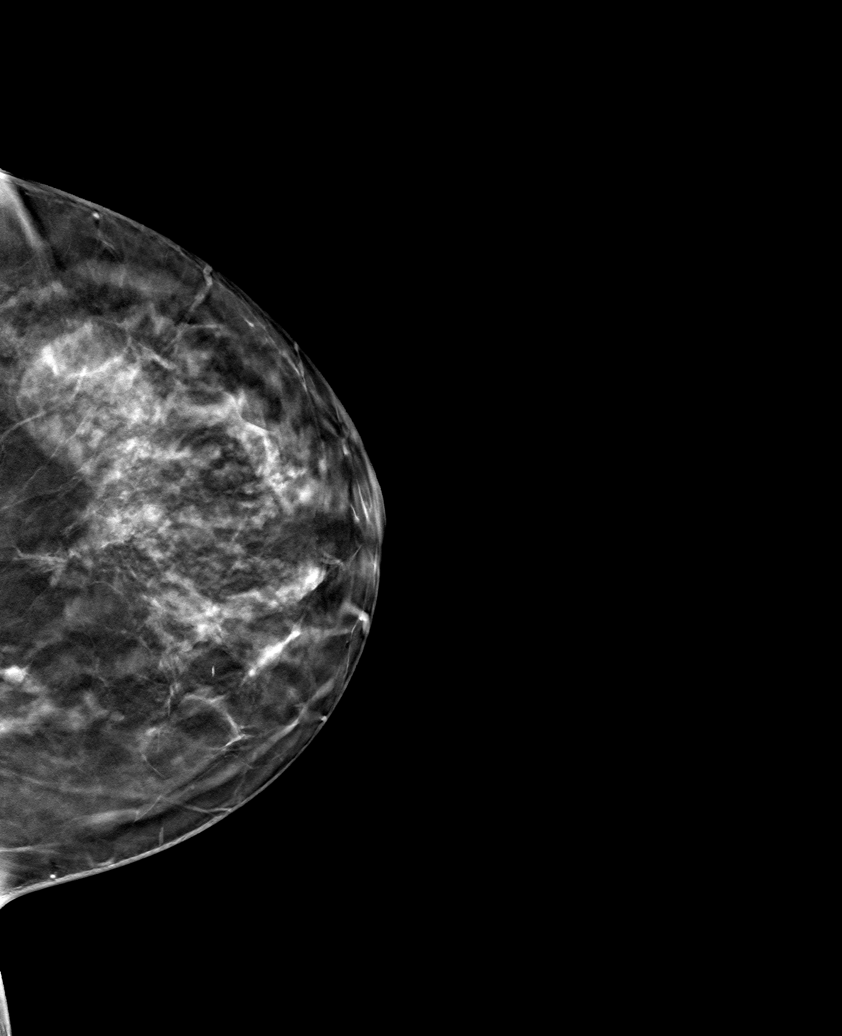

[R MLO tomo · tomo slice 39/78.0]
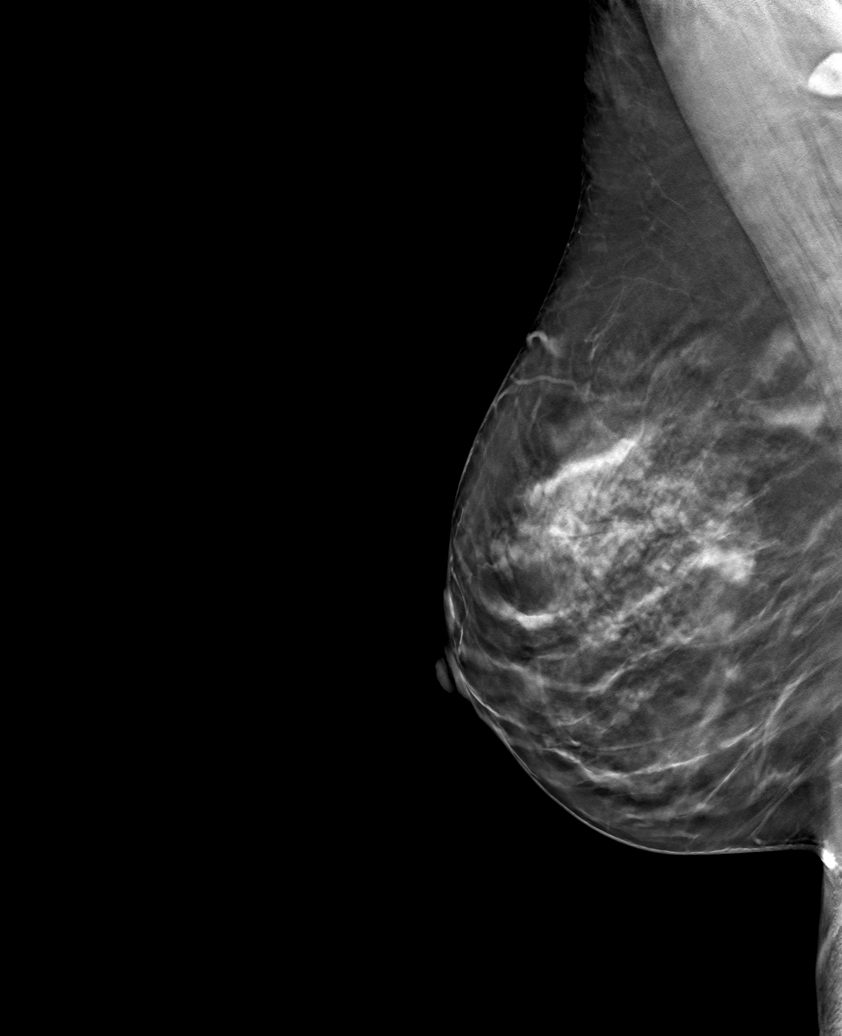

[R CC tomo · tomo slice 39/78.0]
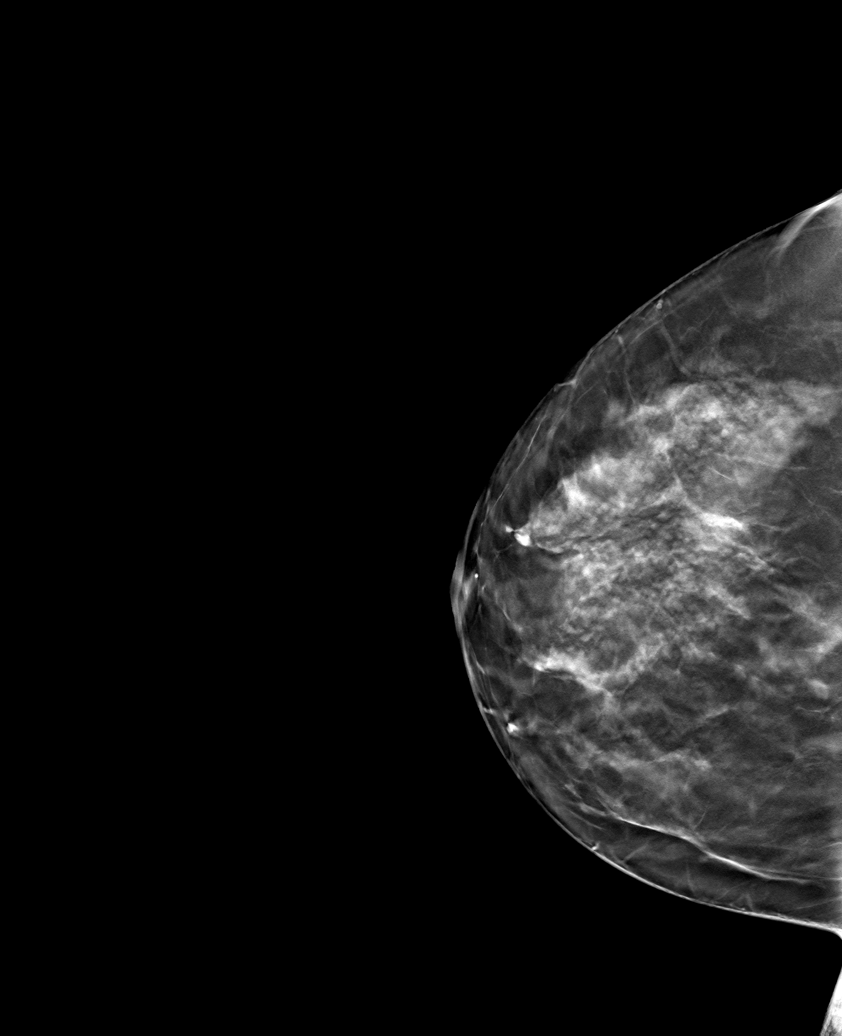

[3 of 15 positions shown; findings below may reference images not displayed]

ACR Breast Density Category c: The breast tissue is heterogeneously
dense, which may obscure small masses.
FINDINGS: There are no findings suspicious for malignancy. Images were
processed with CAD.
IMPRESSION: No mammographic evidence of malignancy. A result letter of this
screening mammogram will be mailed directly to the patient.

RECOMMENDATION:
Screening mammogram in one year. (Code:FT-U-LHB)

BI-RADS CATEGORY  1: Negative.

## 2020-04-08 ENCOUNTER — Other Ambulatory Visit: Payer: Self-pay | Admitting: Obstetrics and Gynecology

## 2020-04-08 DIAGNOSIS — Z1231 Encounter for screening mammogram for malignant neoplasm of breast: Secondary | ICD-10-CM

## 2020-04-16 ENCOUNTER — Other Ambulatory Visit: Payer: Self-pay

## 2020-04-16 ENCOUNTER — Ambulatory Visit
Admission: RE | Admit: 2020-04-16 | Discharge: 2020-04-16 | Disposition: A | Discharge status: Home / Self Care | Payer: 59 | Source: Ambulatory Visit

## 2020-04-16 DIAGNOSIS — Z1231 Encounter for screening mammogram for malignant neoplasm of breast: Secondary | ICD-10-CM

## 2020-04-23 ENCOUNTER — Other Ambulatory Visit: Payer: Self-pay | Admitting: Obstetrics and Gynecology

## 2020-04-23 DIAGNOSIS — R928 Other abnormal and inconclusive findings on diagnostic imaging of breast: Secondary | ICD-10-CM

## 2020-05-08 ENCOUNTER — Ambulatory Visit
Admission: RE | Admit: 2020-05-08 | Discharge: 2020-05-08 | Disposition: A | Discharge status: Home / Self Care | Payer: 59 | Source: Ambulatory Visit | Attending: Obstetrics and Gynecology | Admitting: Obstetrics and Gynecology

## 2020-05-08 ENCOUNTER — Other Ambulatory Visit: Payer: 59

## 2020-05-08 ENCOUNTER — Other Ambulatory Visit: Payer: Self-pay

## 2020-05-08 DIAGNOSIS — R928 Other abnormal and inconclusive findings on diagnostic imaging of breast: Secondary | ICD-10-CM

## 2021-05-06 ENCOUNTER — Other Ambulatory Visit: Payer: Self-pay | Admitting: Obstetrics and Gynecology

## 2021-05-06 DIAGNOSIS — Z1231 Encounter for screening mammogram for malignant neoplasm of breast: Secondary | ICD-10-CM

## 2021-05-09 ENCOUNTER — Ambulatory Visit
Admission: RE | Admit: 2021-05-09 | Discharge: 2021-05-09 | Disposition: A | Discharge status: Home / Self Care | Payer: 59 | Source: Ambulatory Visit

## 2021-05-09 DIAGNOSIS — Z1231 Encounter for screening mammogram for malignant neoplasm of breast: Secondary | ICD-10-CM

## 2022-07-23 IMAGING — MG MM DIGITAL SCREENING BILAT W/ TOMO AND CAD
8 series · 8 of 24 positions shown · non-contrast
Comparison: Previous exam(s).

CLINICAL DATA: Screening.

EXAM:
DIGITAL SCREENING BILATERAL MAMMOGRAM WITH TOMOSYNTHESIS AND CAD
TECHNIQUE: Bilateral screening digital craniocaudal and mediolateral oblique
mammograms were obtained. Bilateral screening digital breast
tomosynthesis was performed. The images were evaluated with
computer-aided detection.

[L MLO synth-2D]
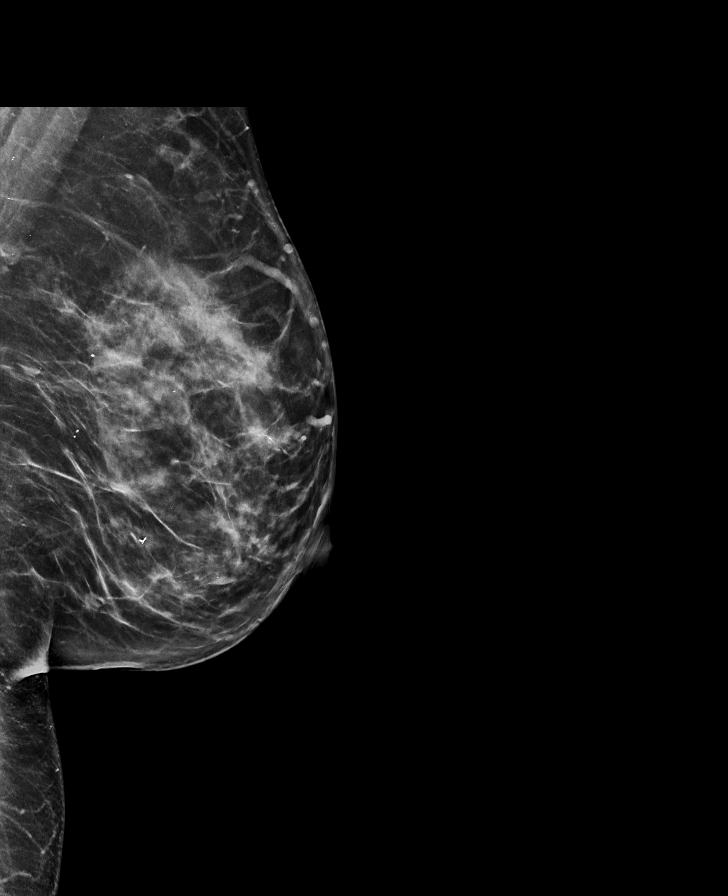

[R CC synth-2D]
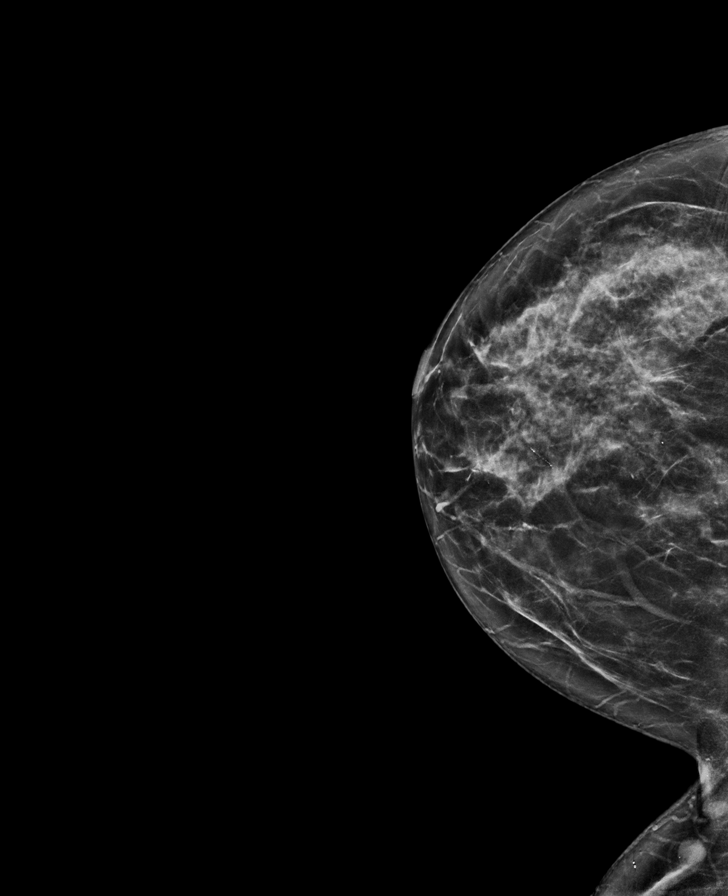

[L CC synth-2D]
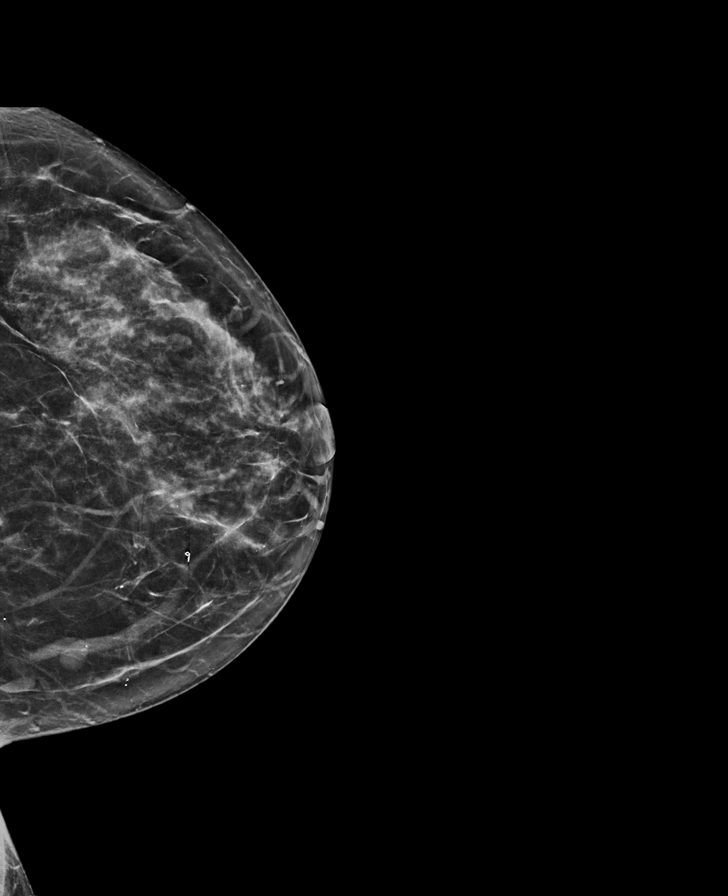

[R MLO synth-2D]
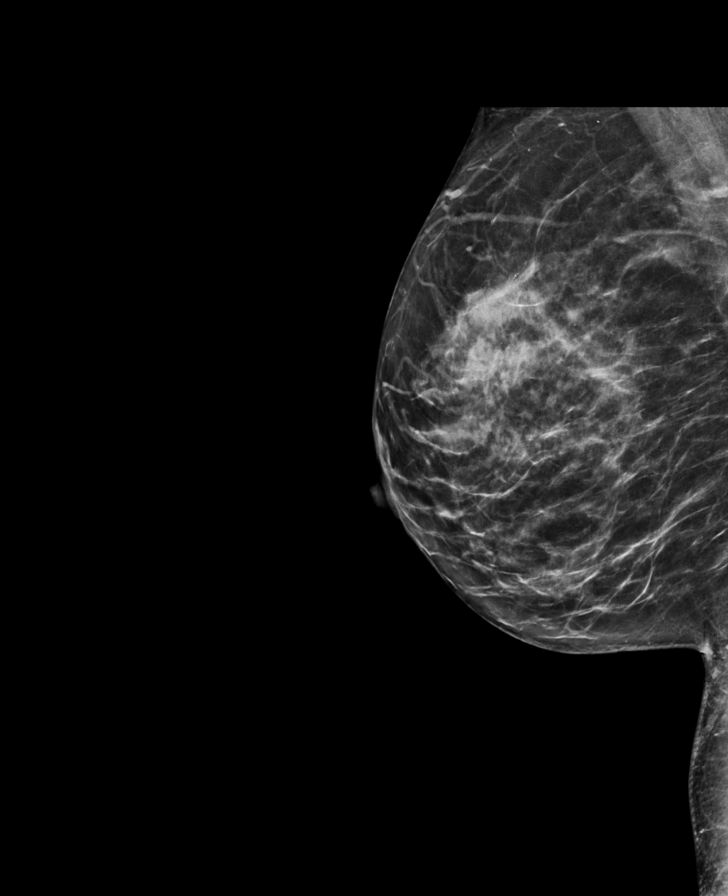

[R MLO tomo · tomo slice 37/72.0]
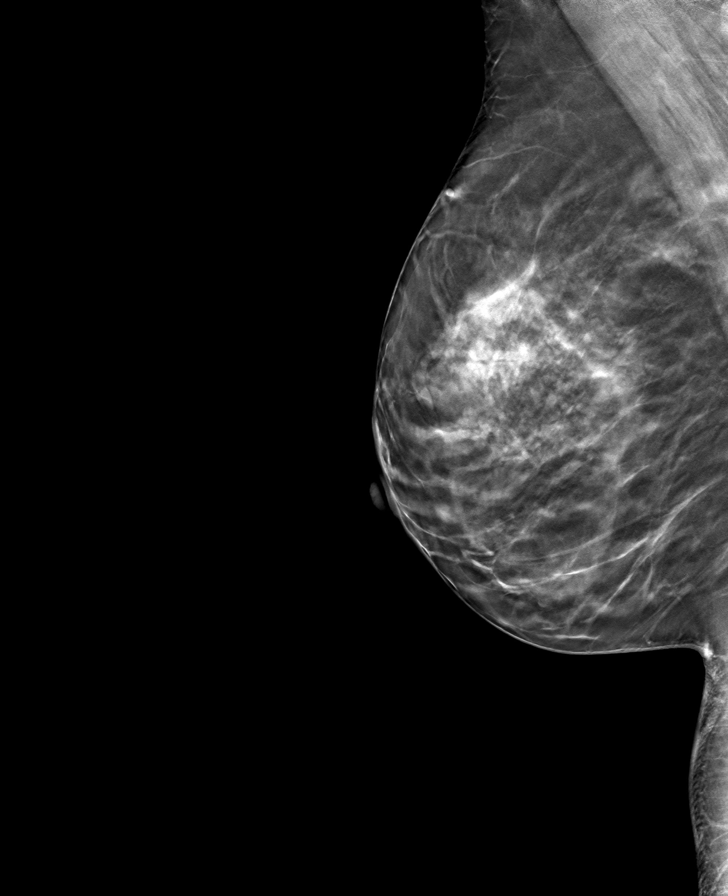

[L MLO tomo · tomo slice 39/76.0]
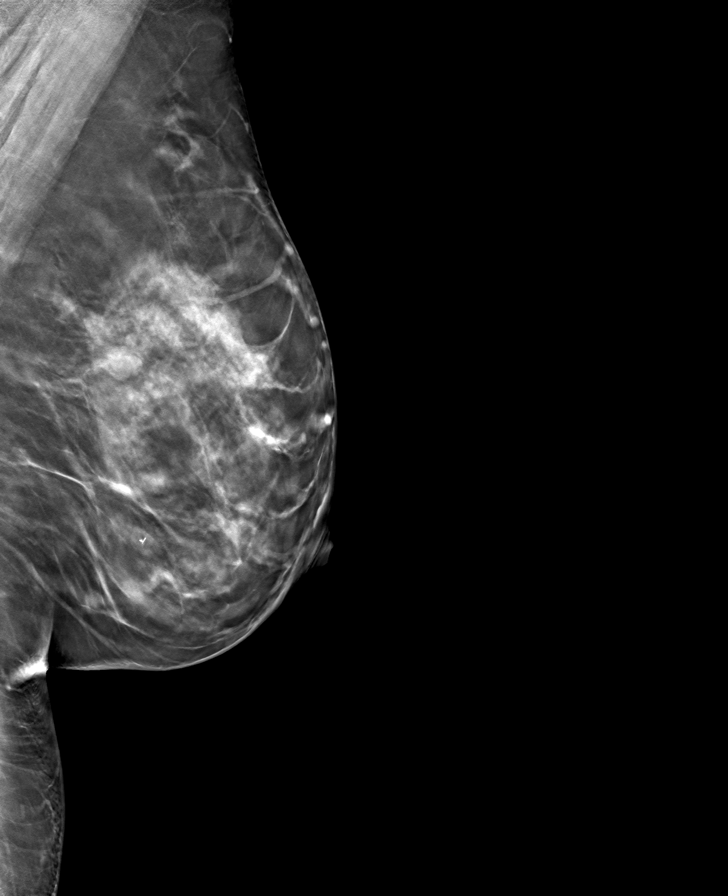

[R CC tomo · tomo slice 36/71.0]
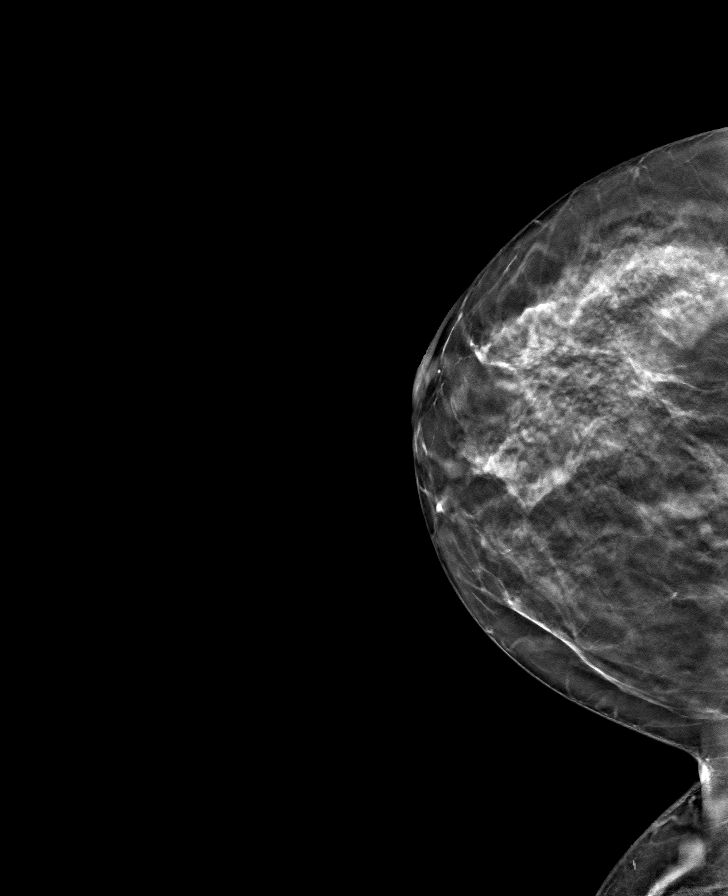

[L CC tomo · tomo slice 33/64.0]
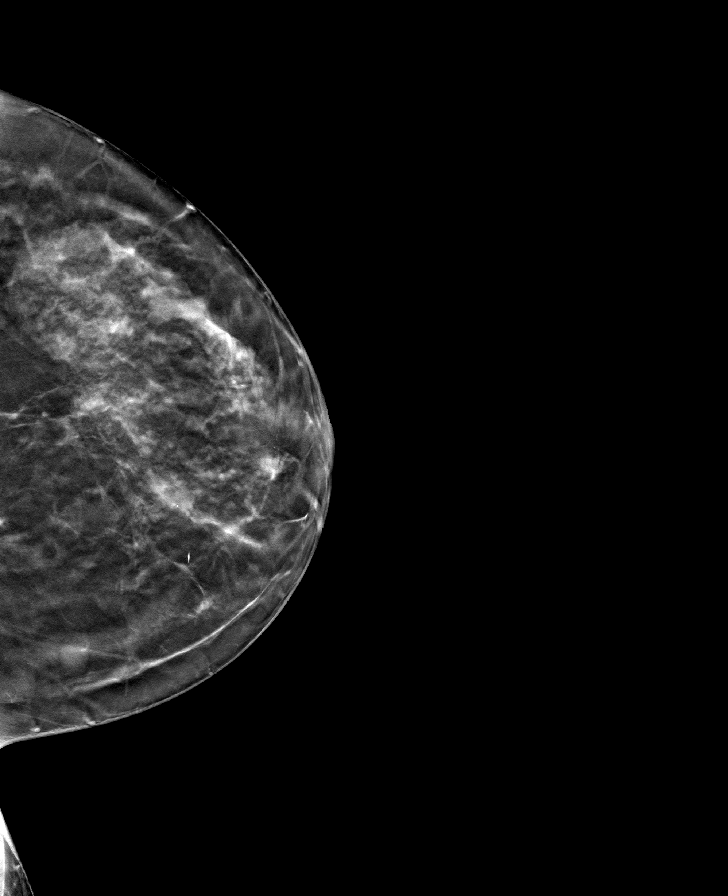

[8 of 24 positions shown; findings below may reference images not displayed]

ACR Breast Density Category c: The breast tissue is heterogeneously
dense, which may obscure small masses.
FINDINGS: In the left breast, a possible mass warrants further evaluation. In
the right breast, no findings suspicious for malignancy.
IMPRESSION: Further evaluation is suggested for a possible mass in the left
breast.

RECOMMENDATION:
Diagnostic mammogram and possibly ultrasound of the left breast.
(Code:C3-P-XXB)

The patient will be contacted regarding the findings, and additional
imaging will be scheduled.

BI-RADS CATEGORY  0: Incomplete. Need additional imaging evaluation
and/or prior mammograms for comparison.

## 2022-10-12 ENCOUNTER — Ambulatory Visit
Admission: RE | Admit: 2022-10-12 | Discharge: 2022-10-12 | Disposition: A | Discharge status: Home / Self Care | Payer: 59 | Source: Ambulatory Visit | Attending: Family Medicine | Admitting: Family Medicine

## 2022-10-12 VITALS — BP 133/84 | HR 67 | Temp 97.8°F | Resp 16

## 2022-10-12 DIAGNOSIS — S50861A Insect bite (nonvenomous) of right forearm, initial encounter: Secondary | ICD-10-CM

## 2022-10-12 DIAGNOSIS — W57XXXA Bitten or stung by nonvenomous insect and other nonvenomous arthropods, initial encounter: Secondary | ICD-10-CM | POA: Diagnosis not present

## 2022-10-12 DIAGNOSIS — L03113 Cellulitis of right upper limb: Secondary | ICD-10-CM

## 2022-10-12 MED ORDER — CEPHALEXIN 500 MG PO CAPS: 500.0000 mg | ORAL_CAPSULE | Freq: Three times a day (TID) | ORAL | 0 refills | Status: AC

## 2022-10-12 NOTE — Discharge Instructions (Signed)
You were seen today after an insect bite.  I have sent out an oral antibiotic to cover for any secondary infection.  Please take this three times/day x 7 days.  I recommend you use ice to the area to help with swelling and redness.  You may use topical benadryl as well to help  with itching and warmth.  Please follow up if not improving or worsening in the next 24-48hrs.

## 2022-10-12 NOTE — ED Triage Notes (Signed)
Pt presents with bee sting on right forearm xs 2 days.

## 2022-10-12 NOTE — ED Provider Notes (Signed)
[facility]    CSN: [id number] Arrival date & time: 10/12/22  1549      History   Chief Complaint Chief Complaint  Patient presents with   Insect Bite    HPI Julie [name] is a 52 y.o. female.   She was stung by a wasp 2 days ago at the right forearm.  Starting yesterday it has been more swollen, red, achy pain.  Her fingers may go numb at times.  She has been using oral benadryl.   She did not apply ice.        History reviewed. No pertinent past medical history.  There are no problems to display for this patient.   Past Surgical History:  Procedure Laterality Date   BREAST BIOPSY Left    benign   CHOLECYSTECTOMY     uterine ablation     for menorrhagia    OB History   No obstetric history on file.      Home Medications    Prior to Admission medications   Medication Sig Start Date End Date Taking? Authorizing Provider  nitrofurantoin, macrocrystal-monohydrate, (MACROBID) 100 MG capsule Take 1 capsule (100 mg total) by mouth 2 (two) times daily. 05/17/17   Shade Flood, MD  nortriptyline (PAMELOR) 75 MG capsule Take 20 mg by mouth at bedtime.     [provider]  phenazopyridine (PYRIDIUM) 100 MG tablet Take 1 tablet (100 mg total) by mouth 3 (three) times daily as needed for pain. 05/17/17   Shade Flood, MD  spironolactone (ALDACTONE) 50 MG tablet Take 50 mg by mouth daily.    [provider]  triamcinolone cream (KENALOG) 0.1 % Apply 1 application topically 2 (two) times daily. 09/09/15   Peyton Najjar, MD    Family History Family History  Problem Relation Age of Onset   Cancer Maternal Grandmother    Cancer Paternal Grandmother    Breast cancer Paternal Grandmother     Social History Social History   Tobacco Use   Smoking status: Never   Smokeless tobacco: Never  Substance Use Topics   Alcohol use: No   Drug use: No     Allergies   Patient has no known allergies.   Review of  Systems Review of Systems  Constitutional: Negative.   HENT: Negative.    Respiratory: Negative.    Cardiovascular: Negative.   Gastrointestinal: Negative.   Musculoskeletal: Negative.   Skin:  Positive for color change and wound.     Physical Exam Triage Vital Signs ED Triage Vitals  Encounter Vitals Group     BP 10/12/22 1620 133/84     Systolic BP Percentile --      Diastolic BP Percentile --      Pulse Rate 10/12/22 1620 67     Resp 10/12/22 1620 16     Temp 10/12/22 1620 97.8 F (36.6 C)     Temp Source 10/12/22 1620 Oral     SpO2 10/12/22 1620 98 %     Weight --      Height --      Head Circumference --      Peak Flow --      Pain Score 10/12/22 1619 3     Pain Loc --      Pain Education --      Exclude from Growth Chart --    No data found.  Updated Vital Signs BP 133/84 (BP Location: Left Arm)   Pulse 67  Temp 97.8 F (36.6 C) (Oral)   Resp 16   SpO2 98%   Visual Acuity Right Eye Distance:   Left Eye Distance:   Bilateral Distance:    Right Eye Near:   Left Eye Near:    Bilateral Near:     Physical Exam Constitutional:      Appearance: Normal appearance.  Skin:    Comments: At the right forearm is a small insect bite.  There is surrounding warmth and erythema to this bite.  There is also a secondary area around this that is slightly pink and warm   Neurological:     General: No focal deficit present.     Mental Status: She is alert.  Psychiatric:        Mood and Affect: Mood normal.      UC Treatments / Results  Labs (all labs ordered are listed, but only abnormal results are displayed) Labs Reviewed - No data to display  EKG   Radiology No results found.  Procedures Procedures (including critical care time)  Medications Ordered in UC Medications - No data to display  Initial Impression / Assessment and Plan / UC Course  I have reviewed the triage vital signs and the nursing notes.  Pertinent labs & imaging results that  were available during my care of the patient were reviewed by me and considered in my medical decision making (see chart for details).   Final Clinical Impressions(s) / UC Diagnoses   Final diagnoses:  Insect bite of right forearm, initial encounter  Cellulitis of right upper extremity     Discharge Instructions      You were seen today after an insect bite.  I have sent out an oral antibiotic to cover for any secondary infection.  Please take this three times/day x 7 days.  I recommend you use ice to the area to help with swelling and redness.  You may use topical benadryl as well to help  with itching and warmth.  Please follow up if not improving or worsening in the next 24-48hrs.     ED Prescriptions     Medication Sig Dispense Auth. Provider   cephALEXin (KEFLEX) 500 MG capsule Take 1 capsule (500 mg total) by mouth 3 (three) times daily for 7 days. 21 capsule Jannifer Franklin, MD      PDMP not reviewed this encounter.   Jannifer Franklin, MD 10/12/22 [phone]

## 2022-11-04 ENCOUNTER — Other Ambulatory Visit: Payer: Self-pay | Admitting: Obstetrics and Gynecology

## 2022-11-04 DIAGNOSIS — Z1231 Encounter for screening mammogram for malignant neoplasm of breast: Secondary | ICD-10-CM

## 2022-11-06 ENCOUNTER — Ambulatory Visit
Admission: RE | Admit: 2022-11-06 | Discharge: 2022-11-06 | Disposition: A | Discharge status: Home / Self Care | Payer: 59 | Source: Ambulatory Visit | Attending: Obstetrics and Gynecology | Admitting: Obstetrics and Gynecology

## 2022-11-06 DIAGNOSIS — Z1231 Encounter for screening mammogram for malignant neoplasm of breast: Secondary | ICD-10-CM

## 2023-11-09 ENCOUNTER — Other Ambulatory Visit: Payer: Self-pay | Admitting: Obstetrics and Gynecology

## 2023-11-09 DIAGNOSIS — Z1231 Encounter for screening mammogram for malignant neoplasm of breast: Secondary | ICD-10-CM

## 2023-11-10 ENCOUNTER — Ambulatory Visit
Admission: RE | Admit: 2023-11-10 | Discharge: 2023-11-10 | Disposition: A | Discharge status: Home / Self Care | Source: Ambulatory Visit | Attending: Obstetrics and Gynecology | Admitting: Obstetrics and Gynecology

## 2023-11-10 DIAGNOSIS — Z1231 Encounter for screening mammogram for malignant neoplasm of breast: Secondary | ICD-10-CM
